# Patient Record
Sex: Male | Born: 1950 | Hispanic: No | Marital: Married | Smoking: Former smoker
Health system: Southern US, Community
[De-identification: ages and names within clinical notes are randomized; demographics above are authoritative.]

## PROBLEM LIST (undated history)

## (undated) DIAGNOSIS — I10 Essential (primary) hypertension: Secondary | ICD-10-CM

## (undated) DIAGNOSIS — E119 Type 2 diabetes mellitus without complications: Secondary | ICD-10-CM

## (undated) HISTORY — DX: Essential (primary) hypertension: I10

## (undated) HISTORY — DX: Type 2 diabetes mellitus without complications: E11.9

---

## 2000-12-27 ENCOUNTER — Emergency Department (HOSPITAL_COMMUNITY): Admission: EM | Admit: 2000-12-27 | Discharge: 2000-12-27 | Payer: Self-pay | Admitting: Emergency Medicine

## 2000-12-27 ENCOUNTER — Encounter: Payer: Self-pay | Admitting: Emergency Medicine

## 2012-12-03 ENCOUNTER — Ambulatory Visit (INDEPENDENT_AMBULATORY_CARE_PROVIDER_SITE_OTHER): Payer: BC Managed Care – PPO | Admitting: Family Medicine

## 2012-12-03 ENCOUNTER — Ambulatory Visit: Payer: BC Managed Care – PPO

## 2012-12-03 VITALS — BP 115/68 | HR 60 | Temp 97.3°F | Resp 18 | Ht 70.5 in | Wt 212.0 lb

## 2012-12-03 DIAGNOSIS — M545 Low back pain, unspecified: Secondary | ICD-10-CM

## 2012-12-03 DIAGNOSIS — R110 Nausea: Secondary | ICD-10-CM

## 2012-12-03 DIAGNOSIS — R319 Hematuria, unspecified: Secondary | ICD-10-CM

## 2012-12-03 DIAGNOSIS — R109 Unspecified abdominal pain: Secondary | ICD-10-CM

## 2012-12-03 DIAGNOSIS — K5900 Constipation, unspecified: Secondary | ICD-10-CM

## 2012-12-03 LAB — POCT CBC
Granulocyte percent: 79.6 %G (ref 37–80)
HCT, POC: 44.2 % (ref 43.5–53.7)
Hemoglobin: 13.8 g/dL — AB (ref 14.1–18.1)
Lymph, poc: 1.6 (ref 0.6–3.4)
MCH, POC: 27.6 pg (ref 27–31.2)
MCHC: 31.2 g/dL — AB (ref 31.8–35.4)
MCV: 88.5 fL (ref 80–97)
MID (cbc): 0.6 (ref 0–0.9)
MPV: 10.4 fL (ref 0–99.8)
POC Granulocyte: 8.5 — AB (ref 2–6.9)
POC LYMPH PERCENT: 14.8 %L (ref 10–50)
POC MID %: 5.6 %M (ref 0–12)
Platelet Count, POC: 130 10*3/uL — AB (ref 142–424)
RBC: 5 M/uL (ref 4.69–6.13)
RDW, POC: 15.4 %
WBC: 10.7 10*3/uL — AB (ref 4.6–10.2)

## 2012-12-03 LAB — COMPREHENSIVE METABOLIC PANEL
ALT: 21 U/L (ref 0–53)
AST: 24 U/L (ref 0–37)
Albumin: 4.2 g/dL (ref 3.5–5.2)
Alkaline Phosphatase: 59 U/L (ref 39–117)
BUN: 10 mg/dL (ref 6–23)
CO2: 26 mEq/L (ref 19–32)
Calcium: 9.7 mg/dL (ref 8.4–10.5)
Chloride: 100 mEq/L (ref 96–112)
Creat: 0.93 mg/dL (ref 0.50–1.35)
Glucose, Bld: 166 mg/dL — ABNORMAL HIGH (ref 70–99)
Potassium: 4.5 mEq/L (ref 3.5–5.3)
Sodium: 135 mEq/L (ref 135–145)
Total Bilirubin: 0.8 mg/dL (ref 0.3–1.2)
Total Protein: 7.2 g/dL (ref 6.0–8.3)

## 2012-12-03 LAB — POCT UA - MICROSCOPIC ONLY
Casts, Ur, LPF, POC: NEGATIVE
Crystals, Ur, HPF, POC: NEGATIVE
Yeast, UA: NEGATIVE

## 2012-12-03 LAB — POCT URINALYSIS DIPSTICK
Glucose, UA: 100
Leukocytes, UA: NEGATIVE
Nitrite, UA: NEGATIVE
Protein, UA: 100
Spec Grav, UA: >=1.03
Urobilinogen, UA: 0.2
pH, UA: 5.5

## 2012-12-03 LAB — POCT GLYCOSYLATED HEMOGLOBIN (HGB A1C): Hemoglobin A1C: 7.3

## 2012-12-03 LAB — IFOBT (OCCULT BLOOD): IFOBT: NEGATIVE

## 2012-12-03 MED ORDER — ONDANSETRON 4 MG PO TBDP: 4.0000 mg | ORAL_TABLET | Freq: Three times a day (TID) | ORAL | Status: DC | PRN

## 2012-12-03 MED ORDER — KETOROLAC TROMETHAMINE 30 MG/ML IJ SOLN
30.0000 mg | Freq: Once | INTRAMUSCULAR | Status: AC
  Administered 2012-12-03: 30 mg via INTRAMUSCULAR

## 2012-12-03 MED ORDER — CIPROFLOXACIN HCL 250 MG PO TABS: 250.0000 mg | ORAL_TABLET | Freq: Two times a day (BID) | ORAL | Status: DC

## 2012-12-03 MED ORDER — KETOROLAC TROMETHAMINE 30 MG/ML IM SOLN: 30.0000 mg | Freq: Once | INTRAMUSCULAR | Status: DC

## 2012-12-03 MED ORDER — IBUPROFEN 600 MG PO TABS: 600.0000 mg | ORAL_TABLET | Freq: Three times a day (TID) | ORAL | Status: DC | PRN

## 2012-12-03 NOTE — Progress Notes (Signed)
[facility]:  Office Visit  Chief Complaint:  Chief Complaint  Patient presents with  . Back Pain  . Hematuria    today  . Abdominal Pain    HPI: Ralph May is a 62 y.o. male who complains of  Low back pain and problems with urinary sxs x 1-1.5 months and was given tamsulosin and started having blood in his urine yesterday. Slow urine stream,  He only goes to the bathroom 2 times per night and more frequently during the day.  He denies kidney stones, yesterday had groin pain and now pain is in the left mid flank area of his back He has diabetes, last HbA1c was 1 year ago He has a 1 month h/o decrease urinary stream. Again no dx of BPH or prostate cancer. He was given flomax by his PCP which he took yesterday. He had a PSA 1 year ago.  Has not had BM today, some bloating and burping, gas. Nonsmoker. Vegetarian. Has not had colonoscopy No fevers, chills, night swetas, unintentional weight loss.  Had some nausea this AM. Last BM  Yesterday, had to strain. H/o constipation?      Past Medical History  Diagnosis Date  . Hypertension   . Diabetes mellitus without complication    History reviewed. No pertinent past surgical history. History   Social History  . Marital Status: Married    Spouse Name: N/A    Number of Children: N/A  . Years of Education: N/A   Social History Main Topics  . Smoking status: Never Smoker   . Smokeless tobacco: None  . Alcohol Use: Yes  . Drug Use: None  . Sexually Active: Yes -- Male partner(s)     Comment: married   Other Topics Concern  . None   Social History Narrative  . None   History reviewed. No pertinent family history. No Known Allergies Prior to Admission medications   Medication Sig Start Date End Date Taking? Authorizing Provider  atenolol (TENORMIN) 50 MG tablet Take 50 mg by mouth daily.   Yes Historical Provider, MD  pioglitazone-metformin (ACTOPLUS MET) 15-850 MG per tablet Take 1 tablet by mouth  once.   Yes Historical Provider, MD  tamsulosin (FLOMAX) 0.4 MG CAPS Take by mouth.   Yes Historical Provider, MD     ROS: The patient denies fevers, chills, night sweats, unintentional weight loss, chest pain, palpitations, wheezing, dyspnea on exertion, vomiting, dysuria, hematuria, melena, numbness, weakness, or tingling.  All other systems have been reviewed and were otherwise negative with the exception of those mentioned in the HPI and as above.    PHYSICAL EXAM: Filed Vitals:   12/03/12 0924  BP: 115/68  Pulse: 60  Temp: 97.3 F (36.3 C)  Resp: 18   Filed Vitals:   12/03/12 0924  Height: 5' 10.5" (1.791 m)  Weight: 212 lb (96.163 kg)   Body mass index is 29.98 kg/(m^2).  General: Alert, no acute distress HEENT:  Normocephalic, atraumatic, oropharynx patent.  Cardiovascular:  Regular rate and rhythm, no rubs murmurs or gallops.  No Carotid bruits, radial pulse intact. No pedal edema.  Respiratory: Clear to auscultation bilaterally.  No wheezes, rales, or rhonchi.  No cyanosis, no use of accessory musculature GI: No organomegaly, abdomen is soft and non-tender but mildly distended, positive bowel sounds.  No masses. Skin: No rashes. Neurologic: Facial musculature symmetric. Psychiatric: Patient is appropriate throughout our interaction. Lymphatic: No cervical lymphadenopathy Musculoskeletal: Gait intact.   LABS: Results for orders  placed in visit on 12/03/12  POCT URINALYSIS DIPSTICK      Result Value Range   Color, UA brown     Clarity, UA cloudy     Glucose, UA 100     Bilirubin, UA small     Ketones, UA trace     Spec Grav, UA >=1.030     Blood, UA large     pH, UA 5.5     Protein, UA 100     Urobilinogen, UA 0.2     Nitrite, UA neg     Leukocytes, UA Negative    POCT UA - MICROSCOPIC ONLY      Result Value Range   WBC, Ur, HPF, POC 2-4     RBC, urine, microscopic TNTC     Bacteria, U Microscopic 2+     Mucus, UA trace     Epithelial cells, urine per  micros 2-4     Crystals, Ur, HPF, POC neg     Casts, Ur, LPF, POC neg     Yeast, UA neg    POCT CBC      Result Value Range   WBC 10.7 (*) 4.6 - 10.2 K/uL   Lymph, poc 1.6  0.6 - 3.4   POC LYMPH PERCENT 14.8  10 - 50 %L   MID (cbc) 0.6  0 - 0.9   POC MID % 5.6  0 - 12 %M   POC Granulocyte 8.5 (*) 2 - 6.9   Granulocyte percent 79.6  37 - 80 %G   RBC 5.00  4.69 - 6.13 M/uL   Hemoglobin 13.8 (*) 14.1 - 18.1 g/dL   HCT, POC 16.1  09.6 - 53.7 %   MCV 88.5  80 - 97 fL   MCH, POC 27.6  27 - 31.2 pg   MCHC 31.2 (*) 31.8 - 35.4 g/dL   RDW, POC 04.5     Platelet Count, POC 130 (*) 142 - 424 K/uL   MPV 10.4  0 - 99.8 fL  POCT GLYCOSYLATED HEMOGLOBIN (HGB A1C)      Result Value Range   Hemoglobin A1C 7.3    IFOBT (OCCULT BLOOD)      Result Value Range   IFOBT Negative       EKG/XRAY:   Primary read interpreted by Dr. Conley Rolls at [facility]. No acute cardiopulmonary process No free air No obvious renal stones but hard to tell from gas and also stool load Does not appear to be obstructed but does have heavy stool burden with early signs of ileus   ASSESSMENT/PLAN: Encounter Diagnoses  Name Primary?  . Hematuria   . Back pain, lumbosacral   . Abdominal  pain, other specified site Yes  . Unspecified constipation    I suspect that he has 2 problems  Constipation with possible early signs of ileus and renal stone Rx cipro 250 mg BID x 3 days for prevention of infection with hematuria vs renal stone Gave IM toradol 30 mg x 1 for pain Will not give narcotics since he has constipation with early signs of ileus F/u in 24-48 hrs if no improvement or go to ER prn Rx miralax, colace Rx Ibuprofen for pain Rx Zofran odt for nausea No Solids for now, try thin liquids instead until have regular BMs and abd feels better . Push fluids. Advise f/u with his PCP for Diabetes every 3 months.      Hamilton Capri PHUONG, DO 12/03/2012 11:33 AM

## 2012-12-03 NOTE — Patient Instructions (Signed)
Constipation, Adult  Constipation is when a person has fewer than 3 bowel movements a week; has difficulty having a bowel movement; or has stools that are dry, hard, or larger than normal. As people grow older, constipation is more common. If you try to fix constipation with medicines that make you have a bowel movement (laxatives), the problem may get worse. Long-term laxative use may cause the muscles of the colon to become weak. A low-fiber diet, not taking in enough fluids, and taking certain medicines may make constipation worse.  CAUSES    Certain medicines, such as antidepressants, pain medicine, iron supplements, antacids, and water pills.    Certain diseases, such as diabetes, irritable bowel syndrome (IBS), thyroid disease, or depression.    Not drinking enough water.    Not eating enough fiber-rich foods.    Stress or travel.   Lack of physical activity or exercise.   Not going to the restroom when there is the urge to have a bowel movement.   Ignoring the urge to have a bowel movement.   Using laxatives too much.  SYMPTOMS    Having fewer than 3 bowel movements a week.    Straining to have a bowel movement.    Having hard, dry, or larger than normal stools.    Feeling full or bloated.    Pain in the lower abdomen.   Not feeling relief after having a bowel movement.  DIAGNOSIS   Your caregiver will take a medical history and perform a physical exam. Further testing may be done for severe constipation. Some tests may include:    A barium enema X-ray to examine your rectum, colon, and sometimes, your small intestine.   A sigmoidoscopy to examine your lower colon.   A colonoscopy to examine your entire colon.  TREATMENT   Treatment will depend on the severity of your constipation and what is causing it. Some dietary treatments include drinking more fluids and eating more fiber-rich foods. Lifestyle treatments may include regular exercise. If these diet and lifestyle recommendations  do not help, your caregiver may recommend taking over-the-counter laxative medicines to help you have bowel movements. Prescription medicines may be prescribed if over-the-counter medicines do not work.   HOME CARE INSTRUCTIONS    Increase dietary fiber in your diet, such as fruits, vegetables, whole grains, and beans. Limit high-fat and processed sugars in your diet, such as French fries, hamburgers, cookies, candies, and soda.    A fiber supplement may be added to your diet if you cannot get enough fiber from foods.    Drink enough fluids to keep your urine clear or pale yellow.    Exercise regularly or as directed by your caregiver.    Go to the restroom when you have the urge to go. Do not hold it.   Only take medicines as directed by your caregiver. Do not take other medicines for constipation without talking to your caregiver first.  SEEK IMMEDIATE MEDICAL CARE IF:    You have bright red blood in your stool.    Your constipation lasts for more than 4 days or gets worse.    You have abdominal or rectal pain.    You have thin, pencil-like stools.   You have unexplained weight loss.  MAKE SURE YOU:    Understand these instructions.   Will watch your condition.   Will get help right away if you are not doing well or get worse.  Document Released: 03/30/2004 Document Revised: 09/24/2011 Document Reviewed:   06/05/2011  ExitCare Patient Information 2014 [employer]  Kidney Stones  Kidney stones (ureteral lithiasis) are deposits that form inside your kidneys. The intense pain is caused by the stone moving through the urinary tract. When the stone moves, the ureter goes into spasm around the stone. The stone is usually passed in the urine.   CAUSES    A disorder that makes certain neck glands produce too much parathyroid hormone (primary hyperparathyroidism).   A buildup of uric acid crystals.   Narrowing (stricture) of the ureter.   A kidney obstruction present at birth (congenital  obstruction).   Previous surgery on the kidney or ureters.   Numerous kidney infections.  SYMPTOMS    Feeling sick to your stomach (nauseous).   Throwing up (vomiting).   Blood in the urine (hematuria).   Pain that usually spreads (radiates) to the groin.   Frequency or urgency of urination.  DIAGNOSIS    Taking a history and physical exam.   Blood or urine tests.   Computerized X-ray scan (CT scan).   Occasionally, an examination of the inside of the urinary bladder (cystoscopy) is performed.  TREATMENT    Observation.   Increasing your fluid intake.   Surgery may be needed if you have severe pain or persistent obstruction.  The size, location, and chemical composition are all important variables that will determine the proper choice of action for you. Talk to your caregiver to better understand your situation so that you will minimize the risk of injury to yourself and your kidney.   HOME CARE INSTRUCTIONS    Drink enough water and fluids to keep your urine clear or pale yellow.   Strain all urine through the provided strainer. Keep all particulate matter and stones for your caregiver to see. The stone causing the pain may be as small as a grain of salt. It is very important to use the strainer each and every time you pass your urine. The collection of your stone will allow your caregiver to analyze it and verify that a stone has actually passed.   Only take over-the-counter or prescription medicines for pain, discomfort, or fever as directed by your caregiver.   Make a follow-up appointment with your caregiver as directed.   Get follow-up X-rays if required. The absence of pain does not always mean that the stone has passed. It may have only stopped moving. If the urine remains completely obstructed, it can cause loss of kidney function or even complete destruction of the kidney. It is your responsibility to make sure X-rays and follow-ups are completed. Ultrasounds of the kidney can show  blockages and the status of the kidney. Ultrasounds are not associated with any radiation and can be performed easily in a matter of minutes.  SEEK IMMEDIATE MEDICAL CARE IF:    Pain cannot be controlled with the prescribed medicine.   You have a fever.   The severity or intensity of pain increases over 18 hours and is not relieved by pain medicine.   You develop a new onset of abdominal pain.   You feel faint or pass out.  MAKE SURE YOU:    Understand these instructions.   Will watch your condition.   Will get help right away if you are not doing well or get worse.  Document Released: 07/02/2005 Document Revised: 09/24/2011 Document Reviewed: 10/28/2009  Exit[employer] Patient Information 2014 [employer].

## 2012-12-05 ENCOUNTER — Telehealth: Payer: Self-pay | Admitting: Family Medicine

## 2012-12-05 NOTE — Telephone Encounter (Signed)
LM regarding CMP results. Also wanted to know if he is feeling better.

## 2014-02-03 IMAGING — CR DG ABDOMEN ACUTE W/ 1V CHEST
3 series · 3 of 3 positions shown · non-contrast
Comparison: None.

CLINICAL DATA: Abdominal pain, back pain, hematuria

ACUTE ABDOMEN SERIES (ABDOMEN 2 VIEW & CHEST 1 VIEW)

[PA]
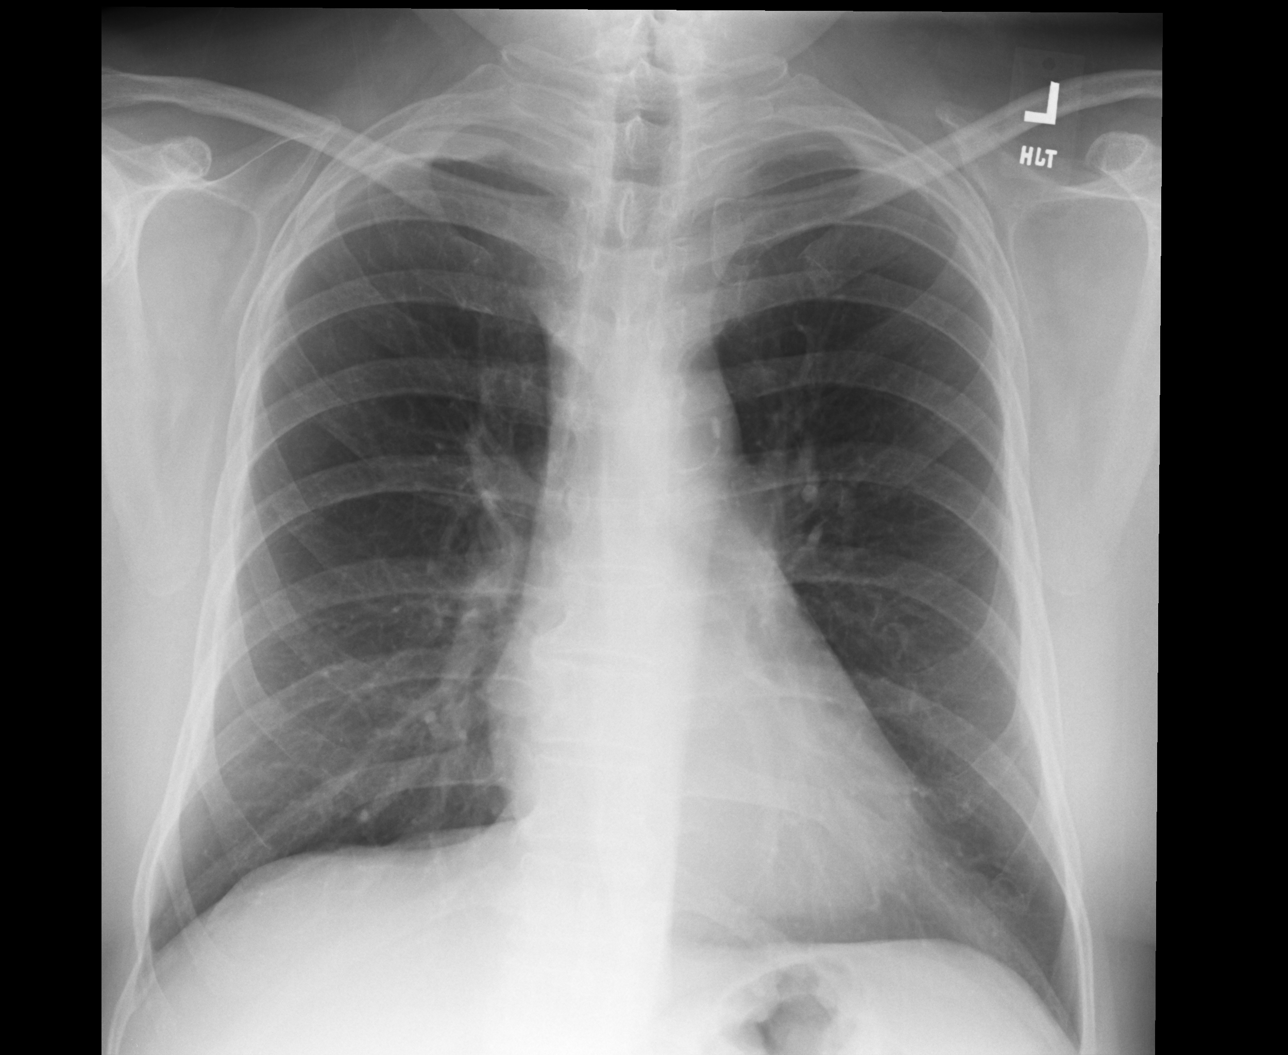

[AP (1 of 2)]
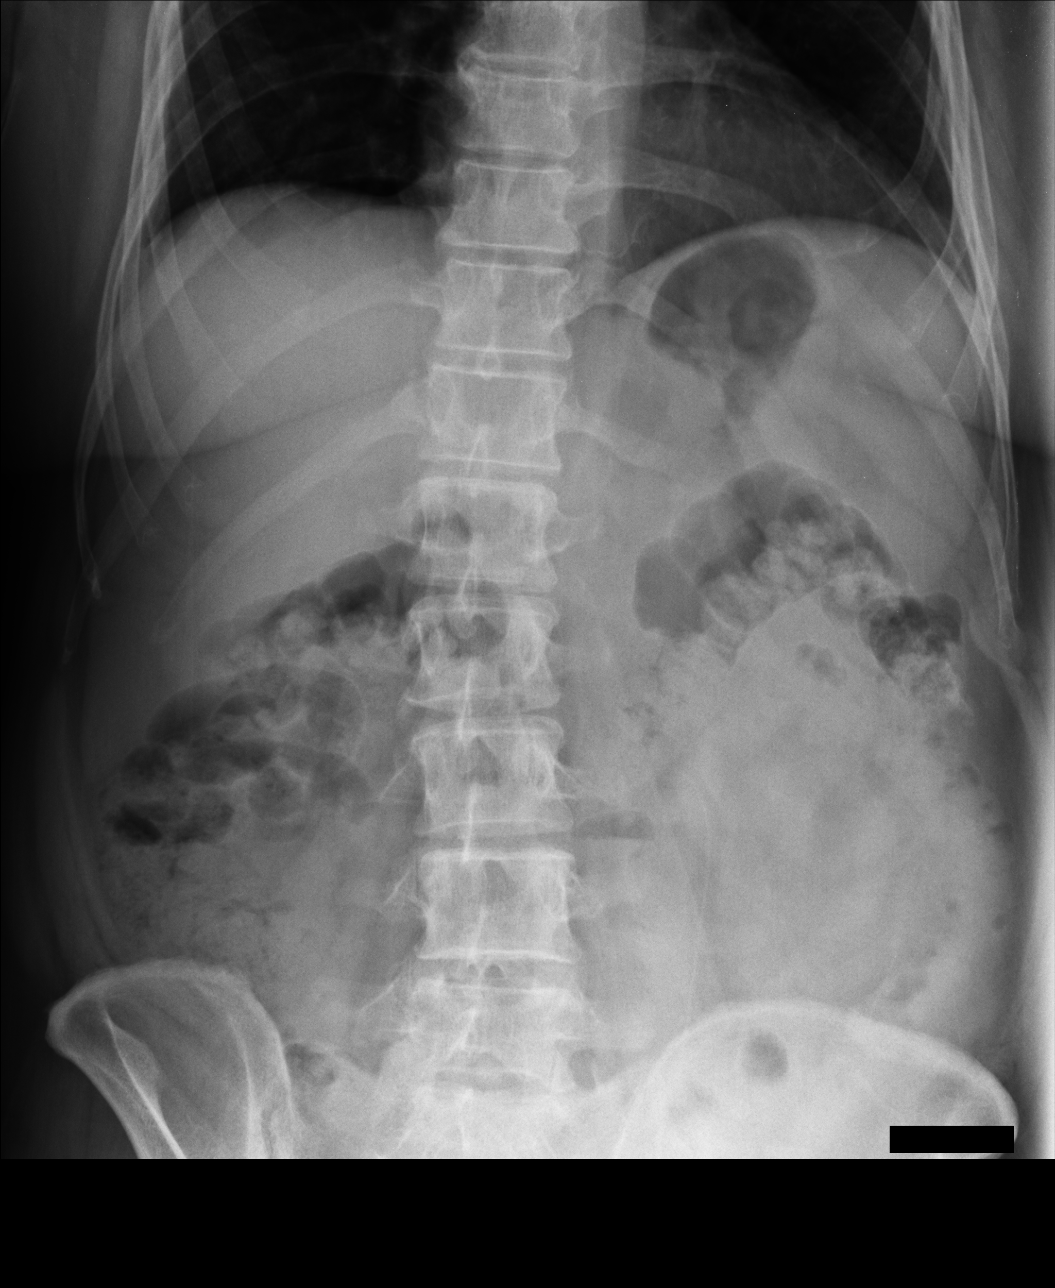

[AP (2 of 2)]
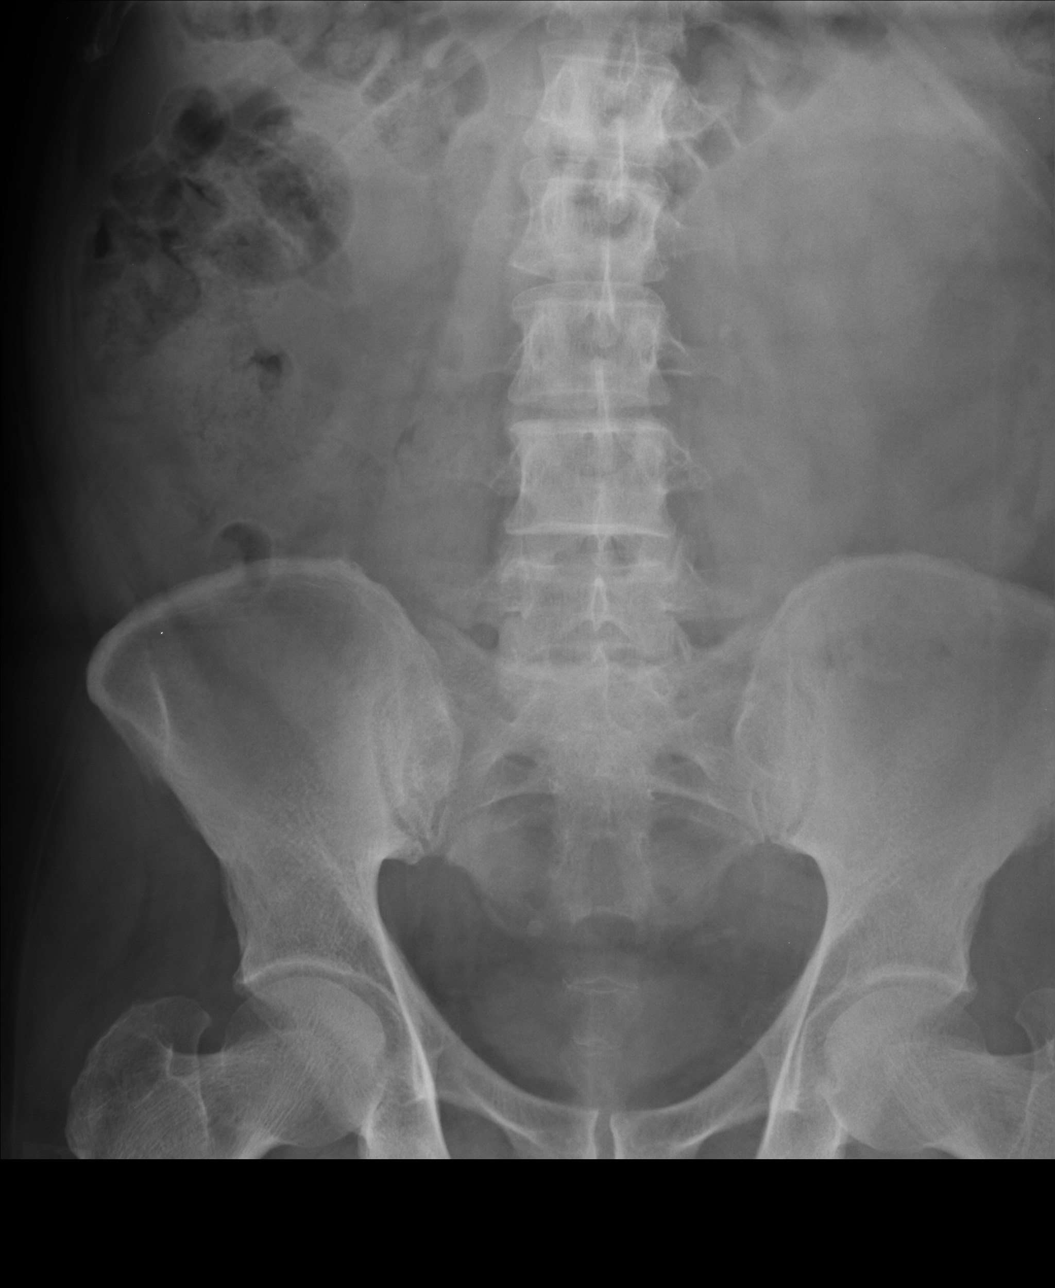

[3 of 3 positions shown; findings below may reference images not displayed]

FINDINGS: Cardiomediastinal silhouette is unremarkable.  No acute
infiltrate or pleural effusion.  No pulmonary edema.
Atherosclerotic calcifications of thoracic aorta.  There is
nonspecific nonobstructive bowel gas pattern.  Significant colonic
stool.  No free abdominal air. No pathologic calcifications are
identified.
IMPRESSION: No acute disease within chest.  Nonspecific nonobstructive bowel
gas pattern.  Significant colonic stool.  No free abdominal air.

## 2019-09-02 ENCOUNTER — Encounter: Payer: Self-pay | Admitting: Cardiology

## 2019-09-02 ENCOUNTER — Other Ambulatory Visit: Payer: Self-pay

## 2019-09-02 ENCOUNTER — Ambulatory Visit: Payer: Self-pay | Admitting: Cardiology

## 2019-09-02 VITALS — BP 134/77 | HR 68 | Temp 97.6°F | Ht 70.5 in | Wt 218.0 lb

## 2019-09-02 DIAGNOSIS — E119 Type 2 diabetes mellitus without complications: Secondary | ICD-10-CM

## 2019-09-02 DIAGNOSIS — R9431 Abnormal electrocardiogram [ECG] [EKG]: Secondary | ICD-10-CM

## 2019-09-02 DIAGNOSIS — I10 Essential (primary) hypertension: Secondary | ICD-10-CM

## 2019-09-02 DIAGNOSIS — E782 Mixed hyperlipidemia: Secondary | ICD-10-CM

## 2019-09-02 NOTE — Progress Notes (Signed)
Primary Physician/Referring:  Patient, No Pcp Per  Patient ID: Sylvio Weatherall, male    DOB: April 22, 1951, 69 y.o.   MRN: [id number]  Chief Complaint  Patient presents with  . New Patient (Initial Visit)    pt says just needs to et checked out not having any issues   HPI:    Mithran Strike  is a 69 y.o. male with history of hypertension, hyperlipidemia and diabetes mellitus with prior history of tobacco use disorder with remote smoking and history of chewing tobacco, quit in 2013.   He is referred back to me for evaluation of abnormal EKG.  Remains asymptomatic without dyspnea, chest pain or palpitations, accompanied by his wife.  Past Medical History:  Diagnosis Date  . Diabetes mellitus without complication ([place])   . Hypertension    History reviewed. No pertinent surgical history. Social History   Tobacco Use  . Smoking status: Former Smoker    Packs/day: 0.50    Years: 45.00    Pack years: 22.50    Types: Cigarettes    Start date: 80    Quit date: 09/05/2011    Years since quitting: 8.0  . Smokeless tobacco: Never Used  Substance Use Topics  . Alcohol use: Yes    Alcohol/week: 3.0 standard drinks    Types: 3 Shots of liquor per week    Comment: Scotch    ROS  Review of Systems  Cardiovascular: Negative for dyspnea on exertion and leg swelling.  Gastrointestinal: Negative for melena.  Genitourinary: Positive for hesitancy.   Objective  Blood pressure 134/77, pulse 68, temperature 97.6 F (36.4 C), height 5' 10.5" (1.791 m), weight 218 lb (98.9 kg), SpO2 97 %.  Vitals with BMI 09/02/2019 12/03/2012  Height 5' 10.5" 5' 10.5"  Weight 218 lbs 212 lbs  BMI 28.31 51.7  Systolic 616 073  Diastolic 77 68  Pulse 68 60     Physical Exam  Cardiovascular: Normal rate, regular rhythm, normal heart sounds and intact distal pulses. Exam reveals no gallop.  No murmur heard. No leg edema, no JVD.  Pulmonary/Chest: Effort normal and breath sounds normal.  Abdominal: Soft.  Bowel sounds are normal.   Laboratory examination:   External labs 05/06/2019:   Total cholesterol 121, triglycerides 146, HDL 43, LDL 40. Hb 13.6/HCT 39.9, platelets 139. A1c 7.7%. BUN 13, EGFR >60 ml, serum creatinine 0.94, potassium 4.3.  LFTs normal. Medications and allergies  No Known Allergies   Current Outpatient Medications  Medication Instructions  . aspirin EC 81 mg, Oral, Daily  . atenolol (TENORMIN) 50 mg, Daily  . cyanocobalamin 100 MCG tablet Oral  . GLIPIZIDE-METFORMIN HCL PO 60-500 mg, Oral, Daily  . losartan-hydrochlorothiazide (HYZAAR) 50-12.5 MG tablet 1 tablet, Oral, Daily  . simvastatin (ZOCOR) 40 mg, Oral, Daily  . SITagliptin-metFORMIN HCl (JANUMET PO) 20-500 mg, Oral, Daily    Radiology:   No results found.  Cardiac Studies:   None  Assessment     ICD-10-CM   1. Nonspecific abnormal electrocardiogram (ECG) (EKG)  R94.31   2. Mixed hyperlipidemia  E78.2 EKG 12-Lead  3. Primary hypertension  I10   4. Controlled type 2 diabetes mellitus without complication, without long-term current use of insulin ([place])  E11.9     EKG 09/02/2019: Sinus rhythm with first-degree block at the rate of 64 bpm leftward axis.  Diffuse T wave abnormality, cannot exclude inferior, lateral ischemia.  Abnormal EKG.  No significant change from  06/02/2014.  No orders of the defined types were  placed in this encounter.   Medications Discontinued During This Encounter  Medication Reason  . ciprofloxacin (CIPRO) 250 MG tablet Error  . ibuprofen (ADVIL,MOTRIN) 600 MG tablet Error  . ondansetron (ZOFRAN ODT) 4 MG disintegrating tablet Error  . pioglitazone-metformin (ACTOPLUS MET) 15-850 MG per tablet Error  . tamsulosin (FLOMAX) 0.4 MG CAPS Error     Recommendations:   Jaxxson Cavanah  is a 69 y.o. male with history of hypertension, hyperlipidemia and diabetes mellitus with prior history of tobacco use disorder with remote smoking and history of chewing tobacco, quit in  2013.  He remains asymptomatic.  I reviewed his labs, lipids in excellent control, diabetes is mildly uncontrolled.  I have had a long discussion with him regarding making changes to his diet and especially avoidance of wheat and wheat products and rice discussed in detail as he is [place] patient.  As he remains asymptomatic, although has abnormal EKG, there is no change from prior EKG 5 years ago.  Hence advised him that unless he notices any exertional symptoms like dyspnea or chest pain or palpitations, we can continue to do primary prevention with cancer risk factor modification.  He is presently on appropriate medical therapy.  I will see him back in 6 months for follow-up due to complaints regarding weight loss, diet changes and specifically advised him to reduce alcohol intake as his platelets are also low as well.  Adrian Prows, MD, [facility] 09/05/2019, 5:16 PM [facility] [place] Office: [phone]

## 2019-09-05 ENCOUNTER — Encounter: Payer: Self-pay | Admitting: Cardiology

## 2020-02-29 ENCOUNTER — Ambulatory Visit: Payer: Self-pay | Admitting: Cardiology
